# Patient Record
Sex: Male | Born: 1995 | Race: Black or African American | Hispanic: No | Marital: Single | Smoking: Never smoker
Health system: Southern US, Community
[De-identification: ages and names within clinical notes are randomized; demographics above are authoritative.]

---

## 2018-12-31 ENCOUNTER — Other Ambulatory Visit: Payer: Self-pay

## 2018-12-31 ENCOUNTER — Emergency Department (HOSPITAL_COMMUNITY)
Admission: EM | Admit: 2018-12-31 | Discharge: 2018-12-31 | Disposition: A | Discharge status: Home / Self Care | Payer: Self-pay | Attending: Emergency Medicine | Admitting: Emergency Medicine

## 2018-12-31 ENCOUNTER — Encounter (HOSPITAL_COMMUNITY): Payer: Self-pay

## 2018-12-31 DIAGNOSIS — R300 Dysuria: Secondary | ICD-10-CM | POA: Insufficient documentation

## 2018-12-31 DIAGNOSIS — Z202 Contact with and (suspected) exposure to infections with a predominantly sexual mode of transmission: Secondary | ICD-10-CM | POA: Insufficient documentation

## 2018-12-31 LAB — URINALYSIS, ROUTINE W REFLEX MICROSCOPIC
Bilirubin Urine: NEGATIVE
GLUCOSE, UA: NEGATIVE mg/dL
KETONES UR: NEGATIVE mg/dL
Leukocytes, UA: NEGATIVE
Nitrite: NEGATIVE
Protein, ur: NEGATIVE mg/dL
Specific Gravity, Urine: 1.01 (ref 1.005–1.030)
pH: 7 (ref 5.0–8.0)

## 2018-12-31 MED ORDER — AZITHROMYCIN 250 MG PO TABS
1000.0000 mg | ORAL_TABLET | Freq: Once | ORAL | Status: AC
  Administered 2018-12-31: 1000 mg via ORAL
  Filled 2018-12-31: qty 4

## 2018-12-31 MED ORDER — CEFTRIAXONE SODIUM 250 MG IJ SOLR
250.0000 mg | Freq: Once | INTRAMUSCULAR | Status: AC
  Administered 2018-12-31: 250 mg via INTRAMUSCULAR
  Filled 2018-12-31: qty 250

## 2018-12-31 MED ORDER — STERILE WATER FOR INJECTION IJ SOLN
INTRAMUSCULAR | Status: AC
  Administered 2018-12-31: 10 mL
  Filled 2018-12-31: qty 10

## 2018-12-31 NOTE — Discharge Instructions (Addendum)
Follow up with the [facility] Department for additional screening and exposure.

## 2018-12-31 NOTE — ED Notes (Signed)
Pt refused discharge vital signs

## 2018-12-31 NOTE — ED Triage Notes (Signed)
Patient c/o dysuria, sore throat, abdominal pain, and a cloud penile drainage x 2-3 days.

## 2018-12-31 NOTE — ED Provider Notes (Signed)
[facility]-EMERGENCY DEPT Provider Note   CSN: [id number] Arrival date & time: 12/31/18  1742     History   Chief Complaint Chief Complaint  Patient presents with  . Dysuria  . Abdominal Pain  . penile drainage  . Sore Throat    HPI Domingo SepKendrick Alridge is a 23 y.o. male who presents to the ED with request for STD check. He has history of similar symptoms treated with antibiotic. Patient reports he is currently sexually active. Patient had recent unprotected sexual intercourse with new male partner. He notes clear penile discharge and pain with urination over the past few days. Associated symptoms include lower abdominal pain, sore throat, and headache. He denies fever, chills, nausea, vomiting, flank pain, urinary frequency, penile lesion or mass.  The history is provided by the patient. No language interpreter was used.  Dysuria  Presenting symptoms: dysuria and penile discharge   Relieved by:  None tried Associated symptoms: abdominal pain   Associated symptoms: no fever, no flank pain, no nausea, no penile redness, no penile swelling, no scrotal swelling, no urinary frequency and no vomiting   Risk factors: new sexual partner, recent sexual activity and unprotected sex   Abdominal Pain  Associated symptoms: dysuria and sore throat   Associated symptoms: no fever, no nausea and no vomiting   Sore Throat  Associated symptoms include abdominal pain and headaches.    History reviewed. No pertinent past medical history.  There are no active problems to display for this patient.   History reviewed. No pertinent surgical history.      Home Medications    Prior to Admission medications   Not on File    Family History Family History  Problem Relation Age of Onset  . Diabetes Mother     Social History Social History   Tobacco Use  . Smoking status: Never Smoker  . Smokeless tobacco: Never Used  Substance Use Topics  . Alcohol use: Never   Frequency: Never  . Drug use: Never     Allergies   Patient has no known allergies.   Review of Systems Review of Systems  Constitutional: Negative for fever.  HENT: Positive for sore throat.   Eyes: Negative for visual disturbance.  Gastrointestinal: Positive for abdominal pain. Negative for nausea and vomiting.  Genitourinary: Positive for discharge and dysuria. Negative for flank pain, frequency, penile swelling and scrotal swelling.  Musculoskeletal: Negative for back pain.  Skin: Negative for rash.  Neurological: Positive for headaches.     Physical Exam Updated Vital Signs BP (!) 144/71 (BP Location: Left Arm)   Pulse (!) 58   Temp 98.8 F (37.1 C) (Oral)   Resp 18   Ht 6\' 6"  (1.981 m)   Wt 96.3 kg   SpO2 98%   BMI 24.54 kg/m   Physical Exam Exam conducted with a chaperone present.  Constitutional:      General: He is not in acute distress.    Appearance: He is well-developed.  HENT:     Head: Normocephalic.     Mouth/Throat:     Pharynx: Oropharynx is clear. No pharyngeal swelling or oropharyngeal exudate.  Eyes:     Extraocular Movements: Extraocular movements intact.  Cardiovascular:     Rate and Rhythm: Bradycardia present.  Pulmonary:     Effort: Pulmonary effort is normal.     Breath sounds: Normal breath sounds.  Abdominal:     General: Abdomen is flat.     Palpations: Abdomen is soft.  Tenderness: There is abdominal tenderness in the suprapubic area. There is no guarding or rebound.  Genitourinary:    Penis: Circumcised. Discharge present.      Scrotum/Testes: Normal.        Right: Swelling not present.        Left: Swelling not present.     Comments: Bilateral palpable lymph nodes Lymphadenopathy:     Lower Body: Right inguinal adenopathy present. Left inguinal adenopathy present.  Neurological:     General: No focal deficit present.     Mental Status: He is alert.  Psychiatric:        Mood and Affect: Mood normal.      ED  Treatments / Results  Labs (all labs ordered are listed, but only abnormal results are displayed) Labs Reviewed  RPR  HIV ANTIBODY (ROUTINE TESTING W REFLEX)  URINALYSIS, ROUTINE W REFLEX MICROSCOPIC  GC/CHLAMYDIA PROBE AMP ([place]) NOT AT [facility]   Radiology No results found.  Procedures Procedures (including critical care time)  Medications Ordered in ED Medications  cefTRIAXone (ROCEPHIN) injection 250 mg (has no administration in time range)  azithromycin (ZITHROMAX) tablet 1,000 mg (has no administration in time range)     Initial Impression / Assessment and Plan / ED Course  I have reviewed the triage vital signs and the nursing notes. Pt presents with concerns for possible STD.  Pt understands that they have GC/Chlamydia cultures pending and that they will need to inform all sexual partners if results return positive. Pt has been treated prophylactically with azithromycin and Rocephin due to pts history. Discussed importance of using protection when sexually active. Patient to f/u with [facility].   Final Clinical Impressions(s) / ED Diagnoses   Final diagnoses:  Dysuria  STD exposure    ED Discharge Orders    None       Kerrie [place], [place] 12/31/18 2118    Gerhard Munch, MD 12/31/18 2328

## 2019-01-01 LAB — HIV ANTIBODY (ROUTINE TESTING W REFLEX): HIV Screen 4th Generation wRfx: NONREACTIVE

## 2019-01-01 LAB — RPR: RPR Ser Ql: NONREACTIVE

## 2019-01-02 LAB — GC/CHLAMYDIA PROBE AMP (~~LOC~~) NOT AT ARMC
Chlamydia: NEGATIVE
Neisseria Gonorrhea: NEGATIVE

## 2019-07-31 ENCOUNTER — Emergency Department (HOSPITAL_COMMUNITY)
Admission: EM | Admit: 2019-07-31 | Discharge: 2019-07-31 | Disposition: A | Discharge status: Home / Self Care | Payer: Self-pay | Attending: Emergency Medicine | Admitting: Emergency Medicine

## 2019-07-31 ENCOUNTER — Other Ambulatory Visit: Payer: Self-pay

## 2019-07-31 ENCOUNTER — Encounter (HOSPITAL_COMMUNITY): Payer: Self-pay

## 2019-07-31 ENCOUNTER — Emergency Department (HOSPITAL_COMMUNITY): Payer: Self-pay

## 2019-07-31 DIAGNOSIS — Y929 Unspecified place or not applicable: Secondary | ICD-10-CM | POA: Diagnosis not present

## 2019-07-31 DIAGNOSIS — S80811A Abrasion, right lower leg, initial encounter: Secondary | ICD-10-CM | POA: Insufficient documentation

## 2019-07-31 DIAGNOSIS — Y999 Unspecified external cause status: Secondary | ICD-10-CM | POA: Diagnosis not present

## 2019-07-31 DIAGNOSIS — S5011XA Contusion of right forearm, initial encounter: Secondary | ICD-10-CM | POA: Insufficient documentation

## 2019-07-31 DIAGNOSIS — Y939 Activity, unspecified: Secondary | ICD-10-CM | POA: Insufficient documentation

## 2019-07-31 DIAGNOSIS — S60811A Abrasion of right wrist, initial encounter: Secondary | ICD-10-CM | POA: Insufficient documentation

## 2019-07-31 DIAGNOSIS — S59911A Unspecified injury of right forearm, initial encounter: Secondary | ICD-10-CM | POA: Diagnosis present

## 2019-07-31 DIAGNOSIS — T148XXA Other injury of unspecified body region, initial encounter: Secondary | ICD-10-CM

## 2019-07-31 MED ORDER — BACITRACIN ZINC 500 UNIT/GM EX OINT
1.0000 "application " | TOPICAL_OINTMENT | Freq: Once | CUTANEOUS | Status: AC
  Administered 2019-07-31: 8 via TOPICAL
  Filled 2019-07-31: qty 0.9
  Filled 2019-07-31: qty 7.2
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
## 2019-07-31 NOTE — ED Notes (Signed)
Patient transported to X-ray 
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
## 2019-07-31 NOTE — Discharge Instructions (Signed)
Take ibuprofen or Naprosyn for the pain.  Apply antibiotic ointment daily to the wound.  Monitor for fever increasing redness or other concerning symptoms

## 2019-12-24 ENCOUNTER — Other Ambulatory Visit: Payer: Self-pay

## 2019-12-24 ENCOUNTER — Encounter (HOSPITAL_COMMUNITY): Payer: Self-pay | Admitting: Emergency Medicine

## 2019-12-24 ENCOUNTER — Emergency Department (HOSPITAL_COMMUNITY)
Admission: EM | Admit: 2019-12-24 | Discharge: 2019-12-24 | Disposition: A | Discharge status: Home / Self Care | Payer: Self-pay | Attending: Emergency Medicine | Admitting: Emergency Medicine

## 2019-12-24 DIAGNOSIS — N342 Other urethritis: Secondary | ICD-10-CM | POA: Insufficient documentation

## 2019-12-24 DIAGNOSIS — R369 Urethral discharge, unspecified: Secondary | ICD-10-CM | POA: Insufficient documentation

## 2019-12-24 DIAGNOSIS — R300 Dysuria: Secondary | ICD-10-CM | POA: Insufficient documentation

## 2019-12-24 LAB — URINALYSIS, ROUTINE W REFLEX MICROSCOPIC
Bilirubin Urine: NEGATIVE
Glucose, UA: NEGATIVE mg/dL
Ketones, ur: 5 mg/dL — AB
Nitrite: NEGATIVE
Protein, ur: 100 mg/dL — AB
Specific Gravity, Urine: 1.032 — ABNORMAL HIGH (ref 1.005–1.030)
WBC, UA: >50 WBC/hpf — ABNORMAL HIGH (ref 0–5)
pH: 5 (ref 5.0–8.0)

## 2019-12-24 LAB — CBC
HCT: 46.9 % (ref 39.0–52.0)
Hemoglobin: 15.2 g/dL (ref 13.0–17.0)
MCH: 32.2 pg (ref 26.0–34.0)
MCHC: 32.4 g/dL (ref 30.0–36.0)
MCV: 99.4 fL (ref 80.0–100.0)
Platelets: 172 10*3/uL (ref 150–400)
RBC: 4.72 MIL/uL (ref 4.22–5.81)
RDW: 11.4 % — ABNORMAL LOW (ref 11.5–15.5)
WBC: 10.6 10*3/uL — ABNORMAL HIGH (ref 4.0–10.5)
nRBC: 0 % (ref 0.0–0.2)

## 2019-12-24 LAB — COMPREHENSIVE METABOLIC PANEL
ALT: 18 U/L (ref 0–44)
AST: 26 U/L (ref 15–41)
Albumin: 4.2 g/dL (ref 3.5–5.0)
Alkaline Phosphatase: 34 U/L — ABNORMAL LOW (ref 38–126)
Anion gap: 11 (ref 5–15)
BUN: 16 mg/dL (ref 6–20)
CO2: 27 mmol/L (ref 22–32)
Calcium: 9 mg/dL (ref 8.9–10.3)
Chloride: 99 mmol/L (ref 98–111)
Creatinine, Ser: 1.28 mg/dL — ABNORMAL HIGH (ref 0.61–1.24)
GFR calc Af Amer: >60 mL/min (ref >60)
GFR calc non Af Amer: >60 mL/min (ref >60)
Glucose, Bld: 106 mg/dL — ABNORMAL HIGH (ref 70–99)
Potassium: 3.6 mmol/L (ref 3.5–5.1)
Sodium: 137 mmol/L (ref 135–145)
Total Bilirubin: 1.6 mg/dL — ABNORMAL HIGH (ref 0.3–1.2)
Total Protein: 7 g/dL (ref 6.5–8.1)

## 2019-12-24 LAB — LIPASE, BLOOD: Lipase: 20 U/L (ref 11–51)

## 2019-12-24 MED ORDER — AZITHROMYCIN 250 MG PO TABS
1000.0000 mg | ORAL_TABLET | Freq: Once | ORAL | Status: AC
  Administered 2019-12-24: 1000 mg via ORAL
  Filled 2019-12-24: qty 4

## 2019-12-24 MED ORDER — CEFTRIAXONE SODIUM 500 MG IJ SOLR
250.0000 mg | Freq: Once | INTRAMUSCULAR | Status: AC
  Administered 2019-12-24: 250 mg via INTRAMUSCULAR
  Filled 2019-12-24: qty 500

## 2019-12-24 MED ORDER — SODIUM CHLORIDE 0.9% FLUSH: 3.0000 mL | Freq: Once | INTRAVENOUS | Status: DC

## 2019-12-24 NOTE — ED Notes (Signed)
Patient verbalizes understanding of discharge instructions. Opportunity for questioning and answers were provided. Armband removed by staff, pt discharged from ED. Ambulated out to lobby  

## 2019-12-24 NOTE — ED Provider Notes (Signed)
[facility] EMERGENCY DEPARTMENT Provider Note   CSN: [id number] Arrival date & time: 12/24/19  0106     History Chief Complaint  Patient presents with  . Abdominal Pain    Allen Mills is a 24 y.o. male.  The history is provided by the patient.  Abdominal Pain Pain location:  Generalized Pain quality: aching   Pain severity:  Mild Onset quality:  Gradual Progression:  Improving Chronicity:  New Relieved by:  Nothing Worsened by:  Nothing Associated symptoms: dysuria   Associated symptoms: no constipation, no cough, no diarrhea, no fever and no vomiting   Patient presents with generalized abdominal pain of 2 days ago.  This pain is improving.  He is now having pain in his groin as well as dysuria.  He reports penile discharge. He reports unprotective sexual intercourse with same partner, last episode was approximately 6 days ago      PMH-none Family History  Problem Relation Age of Onset  . Diabetes Mother     Social History   Tobacco Use  . Smoking status: Never Smoker  . Smokeless tobacco: Never Used  Substance Use Topics  . Alcohol use: Never  . Drug use: Never    Home Medications Prior to Admission medications   Not on File    Allergies    Patient has no known allergies.  Review of Systems   Review of Systems  Constitutional: Negative for fever.  Respiratory: Negative for cough.   Gastrointestinal: Positive for abdominal pain. Negative for constipation, diarrhea and vomiting.  Genitourinary: Positive for discharge and dysuria. Negative for testicular pain.  All other systems reviewed and are negative.   Physical Exam Updated Vital Signs BP 122/69 (BP Location: Right Arm)   Pulse (!) 57   Temp 98.3 F (36.8 C) (Oral)   Resp 18   SpO2 96%   Physical Exam CONSTITUTIONAL: Well developed/well nourished HEAD: Normocephalic/atraumatic EYES: EOMI/PERRL ENMT: Mucous membranes moist NECK: supple no meningeal  signs SPINE/BACK:entire spine nontender CV: S1/S2 noted, no murmurs/rubs/gallops noted LUNGS: Lungs are clear to auscultation bilaterally, no apparent distress ABDOMEN: soft, nontender, no rebound or guarding, bowel sounds noted throughout abdomen GU:no cva tenderness, mild tenderness in the left inguinal region, no abscess noted.  Patient has white penile discharge.  No penile lesions.  No testicular tenderness.  Testicles are descended bilaterally.  No obvious testicular mass. Nurse Tobi Bastos present for exam NEURO: Pt is awake/alert/appropriate, moves all extremitiesx4.  No facial droop.   EXTREMITIES: pulses normal/equal, full ROM SKIN: warm, color normal PSYCH: no abnormalities of mood noted, alert and oriented to situation  ED Results / Procedures / Treatments   Labs (all labs ordered are listed, but only abnormal results are displayed) Labs Reviewed  COMPREHENSIVE METABOLIC PANEL - Abnormal; Notable for the following components:      Result Value   Glucose, Bld 106 (*)    Creatinine, Ser 1.28 (*)    Alkaline Phosphatase 34 (*)    Total Bilirubin 1.6 (*)    All other components within normal limits  CBC - Abnormal; Notable for the following components:   WBC 10.6 (*)    RDW 11.4 (*)    All other components within normal limits  URINALYSIS, ROUTINE W REFLEX MICROSCOPIC - Abnormal; Notable for the following components:   Color, Urine AMBER (*)    APPearance CLOUDY (*)    Specific Gravity, Urine 1.032 (*)    Hgb urine dipstick SMALL (*)    Ketones, ur 5 (*)  Protein, ur 100 (*)    Leukocytes,Ua LARGE (*)    WBC, UA >50 (*)    Bacteria, UA RARE (*)    All other components within normal limits  LIPASE, BLOOD  GC/CHLAMYDIA PROBE AMP ([place]) NOT AT [facility]    EKG None  Radiology No results found.  Procedures Procedures   Medications Ordered in ED Medications  sodium chloride flush (NS) 0.9 % injection 3 mL (has no administration in time range)  cefTRIAXone  (ROCEPHIN) injection 250 mg (has no administration in time range)  azithromycin (ZITHROMAX) tablet 1,000 mg (has no administration in time range)    ED Course  I have reviewed the triage vital signs and the nursing notes.  Pertinent labs  results that were available during my care of the patient were reviewed by me and considered in my medical decision making (see chart for details).    MDM Rules/Calculators/A&P                      Patient reports his biggest concern was that he had an STD.  Patient has obvious penile discharge.  He will be treated empirically for gonorrhea/committee.  We discussed safe sex practices. Final Clinical Impression(s) / ED Diagnoses Final diagnoses:  Urethritis    Rx / DC Orders ED Discharge Orders    None       Ripley Fraise, MD 12/24/19 [phone]

## 2019-12-24 NOTE — ED Triage Notes (Signed)
Patient reports pain across his abdomen onset 2 days ago with dysuria , denies emesis or diarrhea , no fever or chills . Patient added pain at right inner thigh.

## 2019-12-27 LAB — GC/CHLAMYDIA PROBE AMP (~~LOC~~) NOT AT ARMC
Chlamydia: NEGATIVE
Neisseria Gonorrhea: POSITIVE — AB
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
## 2023-06-27 ENCOUNTER — Encounter: Payer: Self-pay | Admitting: Emergency Medicine

## 2023-06-27 ENCOUNTER — Ambulatory Visit
Admission: EM | Admit: 2023-06-27 | Discharge: 2023-06-27 | Disposition: A | Discharge status: Home / Self Care | Payer: Self-pay | Attending: Family Medicine | Admitting: Family Medicine

## 2023-06-27 DIAGNOSIS — R369 Urethral discharge, unspecified: Secondary | ICD-10-CM

## 2023-06-27 DIAGNOSIS — Z202 Contact with and (suspected) exposure to infections with a predominantly sexual mode of transmission: Secondary | ICD-10-CM

## 2023-06-27 LAB — POCT URINALYSIS DIP (MANUAL ENTRY)
Bilirubin, UA: NEGATIVE
Blood, UA: NEGATIVE
Glucose, UA: NEGATIVE mg/dL
Nitrite, UA: NEGATIVE
Spec Grav, UA: >=1.03 — AB (ref 1.010–1.025)
Urobilinogen, UA: 4 E.U./dL — AB
pH, UA: 6.5 (ref 5.0–8.0)

## 2023-06-27 MED ORDER — CEFTRIAXONE SODIUM 500 MG IJ SOLR
500.0000 mg | Freq: Once | INTRAMUSCULAR | Status: AC
Start: 2023-06-27 — End: 2023-06-27
  Administered 2023-06-27: 500 mg via INTRAMUSCULAR

## 2023-06-27 MED ORDER — AZITHROMYCIN 1 G PO PACK
1.0000 g | PACK | Freq: Once | ORAL | Status: AC
  Administered 2023-06-27: 1 g via ORAL

## 2023-06-27 NOTE — Discharge Instructions (Addendum)
Avoid all sexual contact until all symptoms have cleared.

## 2023-06-27 NOTE — ED Notes (Signed)
Patient given Azithromycin 250mg  x 4 orally per Dr Cathren Harsh.

## 2023-06-27 NOTE — ED Triage Notes (Signed)
Patient c/o possible STI sx's.  Patient is having dysuria and white penile discharge.  Denies any hematuria, urgency or frequency.  Denies any OTC pain meds.

## 2023-06-27 NOTE — ED Provider Notes (Signed)
[facility]    CSN: [id number] Arrival date & time: 06/27/23  1001      History   Chief Complaint Chief Complaint  Patient presents with   Dysuria    HPI Allen Mills is a 27 y.o. male.   Patient has had mild dysuria and urethral discharge for about two weeks and he is concerned about possible STD.  He has had unprotected sex prior to onset of symptoms.  He feels well otherwise without urinary urgency, frequency, testicular pain/swelling, and rash.  Review of records reveals that patient last had HIV/RPR testing (both negative) 01/01/20.  He had a positive GC test on 12/24/19.  The history is provided by the patient.  Dysuria Presenting symptoms: dysuria and penile discharge   Presenting symptoms: no penile pain, no scrotal pain and no swelling   Context: spontaneously   Relieved by:  Nothing Worsened by:  Nothing Ineffective treatments:  None tried Associated symptoms: no abdominal pain, no fever, no flank pain, no genital lesions, no genital rash, no groin pain, no hematuria, no penile redness, no penile swelling, no scrotal swelling, no urinary frequency and no urinary hesitation   Risk factors: multiple sexual partners and unprotected sex     History reviewed. No pertinent past medical history.  There are no problems to display for this patient.   History reviewed. No pertinent surgical history.     Home Medications    Prior to Admission medications   Not on File    Family History Family History  Problem Relation Age of Onset   Diabetes Mother     Social History Social History   Tobacco Use   Smoking status: Never   Smokeless tobacco: Never  Vaping Use   Vaping status: Never Used  Substance Use Topics   Alcohol use: Never   Drug use: Never     Allergies   Patient has no known allergies.   Review of Systems Review of Systems  Constitutional:  Negative for activity change, appetite change, chills, diaphoresis, fatigue, fever and  unexpected weight change.  Gastrointestinal:  Negative for abdominal pain.  Genitourinary:  Positive for dysuria and penile discharge. Negative for flank pain, frequency, genital sores, hematuria, hesitancy, penile pain, penile swelling, scrotal swelling, testicular pain and urgency.  Musculoskeletal:  Negative for arthralgias and joint swelling.  Skin:  Negative for rash.  Hematological:  Negative for adenopathy.     Physical Exam Triage Vital Signs ED Triage Vitals  Encounter Vitals Group     BP 06/27/23 1015 (!) 119/59     Systolic BP Percentile --      Diastolic BP Percentile --      Pulse Rate 06/27/23 1015 69     Resp 06/27/23 1015 18     Temp 06/27/23 1015 98.7 F (37.1 C)     Temp Source 06/27/23 1015 Oral     SpO2 06/27/23 1015 96 %     Weight 06/27/23 1017 215 lb (97.5 kg)     Height 06/27/23 1017 6\' 5"  (1.956 m)     Head Circumference --      Peak Flow --      Pain Score 06/27/23 1017 0     Pain Loc --      Pain Education --      Exclude from Growth Chart --    No data found.  Updated Vital Signs BP (!) 119/59 (BP Location: Right Arm)   Pulse 69   Temp 98.7 F (37.1 C) (  Oral)   Resp 18   Ht 6\' 5"  (1.956 m)   Wt 97.5 kg   SpO2 96%   BMI 25.50 kg/m   Visual Acuity Right Eye Distance:   Left Eye Distance:   Bilateral Distance:    Right Eye Near:   Left Eye Near:    Bilateral Near:     Physical Exam Vitals and nursing note reviewed.  Constitutional:      General: He is not in acute distress. HENT:     Head: Normocephalic.     Mouth/Throat:     Mouth: Mucous membranes are moist.     Pharynx: Oropharynx is clear.  Eyes:     Conjunctiva/sclera: Conjunctivae normal.     Pupils: Pupils are equal, round, and reactive to light.  Cardiovascular:     Rate and Rhythm: Normal rate and regular rhythm.     Heart sounds: Normal heart sounds.  Pulmonary:     Breath sounds: Normal breath sounds.  Abdominal:     General: Abdomen is flat.     Palpations:  Abdomen is soft.     Tenderness: There is no abdominal tenderness.  Musculoskeletal:     Cervical back: Neck supple.  Lymphadenopathy:     Cervical: No cervical adenopathy.  Skin:    General: Skin is warm and dry.     Findings: No rash.  Neurological:     Mental Status: He is alert.      UC Treatments / Results  Labs (all labs ordered are listed, but only abnormal results are displayed) Labs Reviewed  POCT URINALYSIS DIP (MANUAL ENTRY) - Abnormal; Notable for the following components:      Result Value   Color, UA other (*)    Ketones, POC UA trace (5) (*)    Spec Grav, UA >=1.030 (*)    Protein Ur, POC trace (*)    Urobilinogen, UA 4.0 (*)    Leukocytes, UA Trace (*)    All other components within normal limits  RPR  HIV ANTIBODY (ROUTINE TESTING W REFLEX)  CYTOLOGY, (ORAL, ANAL, URETHRAL) ANCILLARY ONLY    EKG   Radiology No results found.  Procedures Procedures (including critical care time)  Medications Ordered in UC Medications  cefTRIAXone (ROCEPHIN) injection 500 mg (has no administration in time range)  azithromycin (ZITHROMAX) powder 1 g (1 g Oral Given 06/27/23 1039)    Initial Impression / Assessment and Plan / UC Course  I have reviewed the triage vital signs and the nursing notes.  Pertinent labs & imaging results that were available during my care of the patient were reviewed by me and considered in my medical decision making (see chart for details).    Administered Rocephin 500mg  IM and azithromycin 250mg  four tabs po. GC/chlamydia, HIV, and RPR pending. Followup with Family Doctor if not improved in one week.    Final Clinical Impressions(s) / UC Diagnoses   Final diagnoses:  Urethral discharge in male  Possible exposure to STD     Discharge Instructions      Avoid all sexual contact until all symptoms have cleared.    ED Prescriptions   None       Lattie Haw, MD 06/28/23 1204

## 2023-07-02 ENCOUNTER — Telehealth: Payer: Self-pay | Admitting: Emergency Medicine

## 2023-07-02 MED ORDER — METRONIDAZOLE 500 MG PO TABS: 500.0000 mg | ORAL_TABLET | Freq: Two times a day (BID) | ORAL | 0 refills | Status: AC

## 2023-07-02 NOTE — Telephone Encounter (Signed)
Flagyl for Trichomonas
